# Patient Record
Sex: Male | Born: 1960 | Race: White | Hispanic: No | Marital: Single | Smoking: Current every day smoker
Health system: Southern US, Community
[De-identification: ages and names within clinical notes are randomized; demographics above are authoritative.]

---

## 2006-07-24 ENCOUNTER — Emergency Department (HOSPITAL_COMMUNITY): Admission: EM | Admit: 2006-07-24 | Discharge: 2006-07-24 | Payer: Self-pay | Admitting: Emergency Medicine

## 2006-07-26 ENCOUNTER — Emergency Department (HOSPITAL_COMMUNITY): Admission: EM | Admit: 2006-07-26 | Discharge: 2006-07-26 | Payer: Self-pay | Admitting: Emergency Medicine

## 2007-12-01 IMAGING — CR DG FOOT COMPLETE 3+V*L*
3 series · 3 of 3 positions shown · non-contrast
Comparison: none

CLINICAL DATA: Pain and swelling

Left foot three-view:
No previous for comparison. Normal alignment and mineralization. Negative for
fracture or other acute bone injury. No significant degenerative change. No
radiodense foreign body.

[view not recorded (1 of 3)]
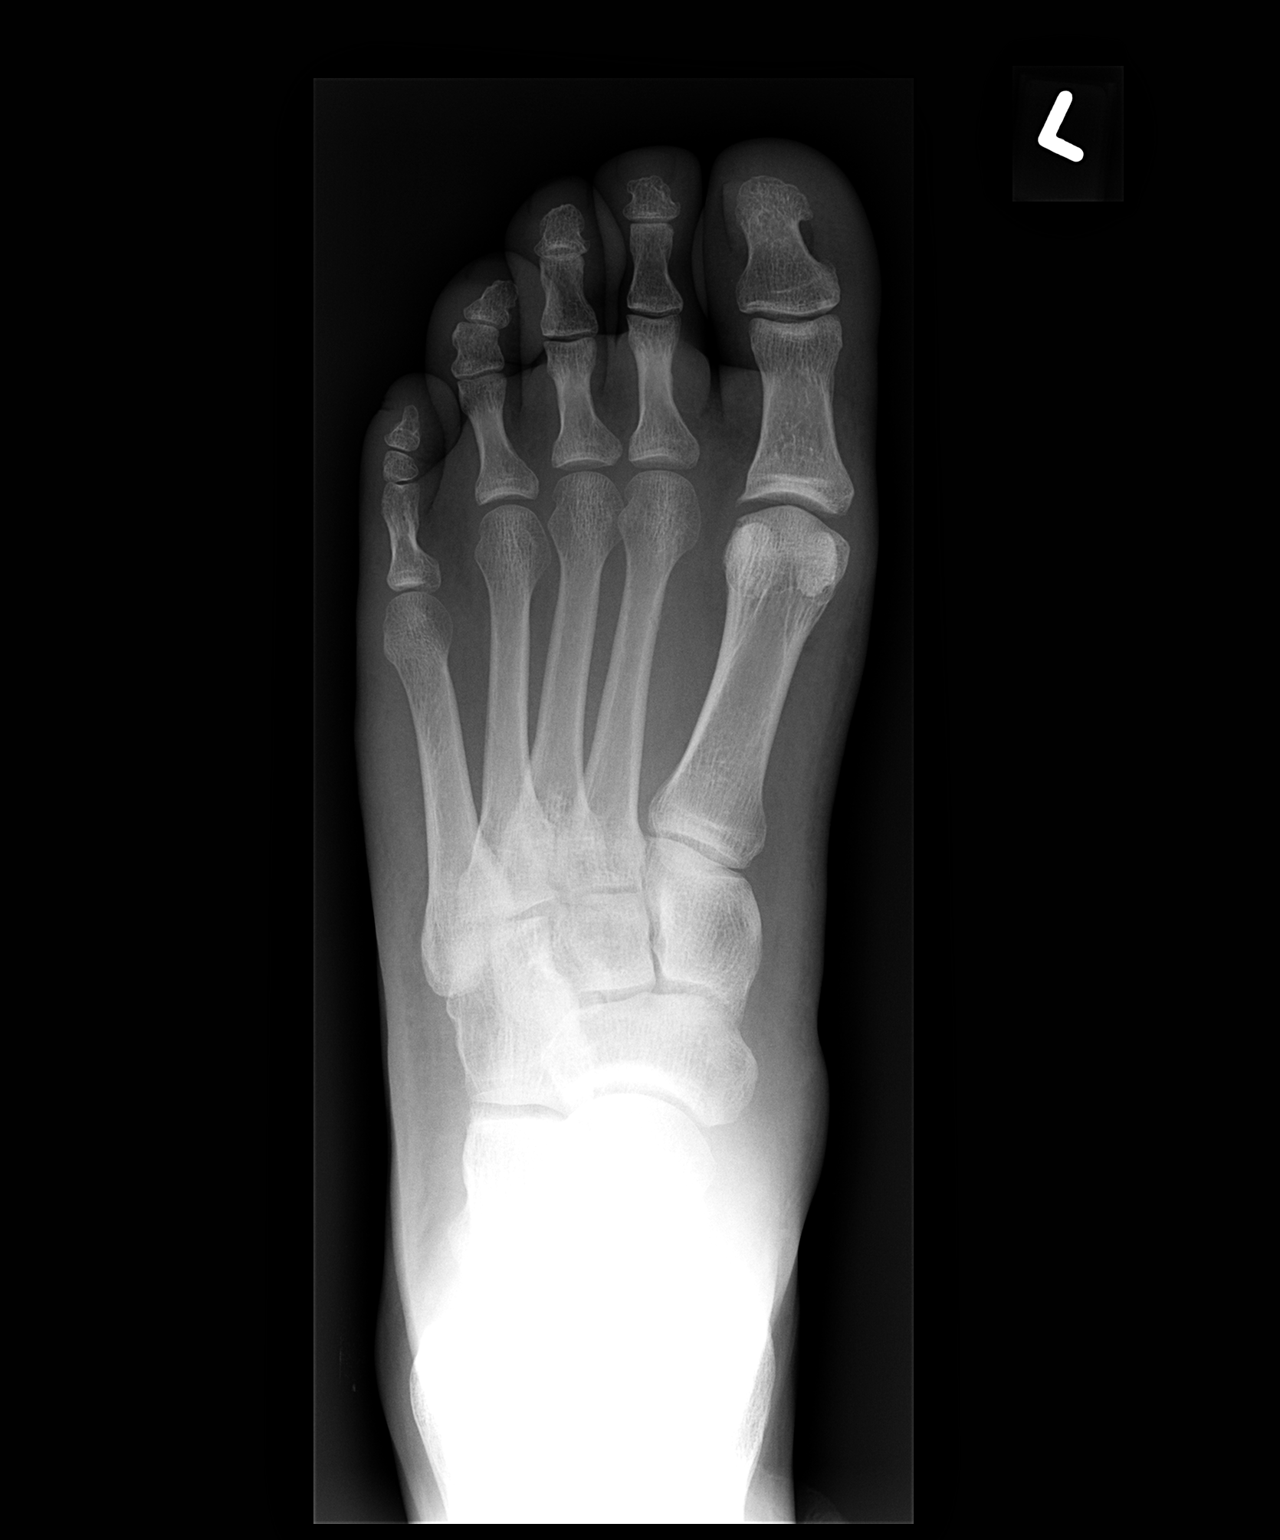

[view not recorded (2 of 3)]
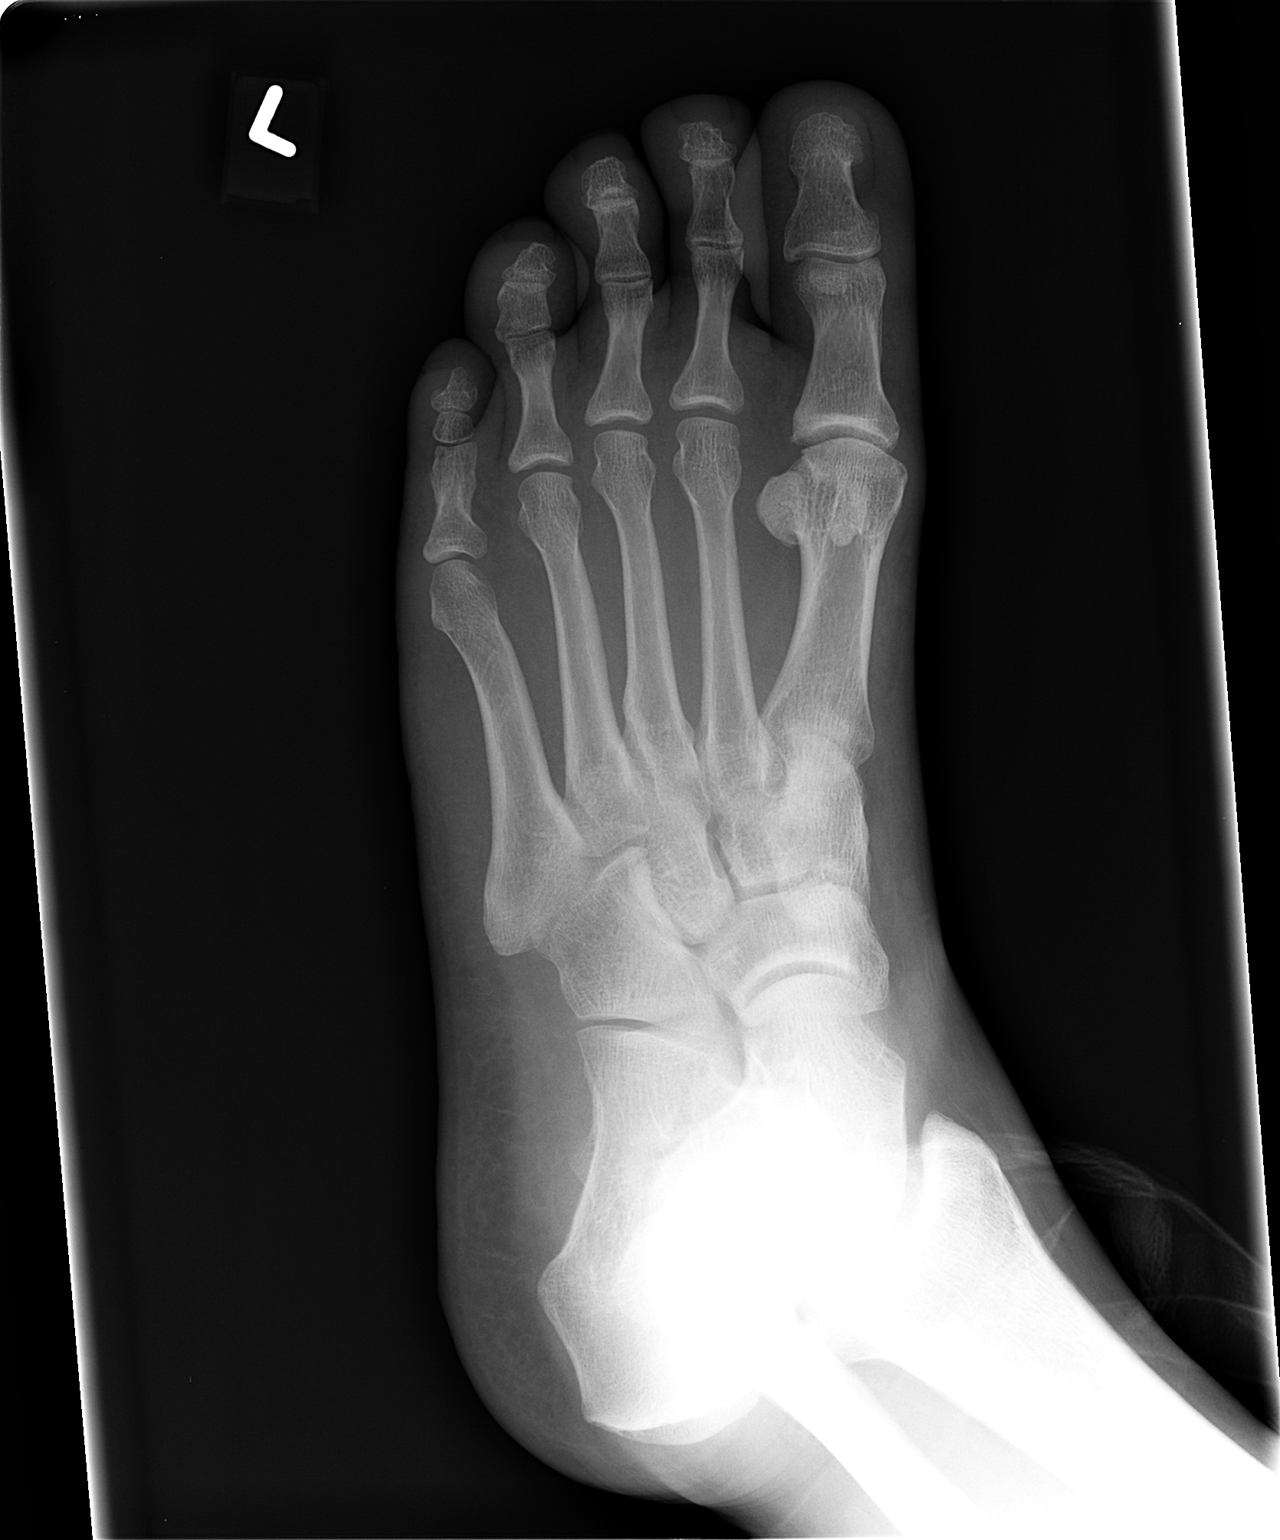

[view not recorded (3 of 3)]
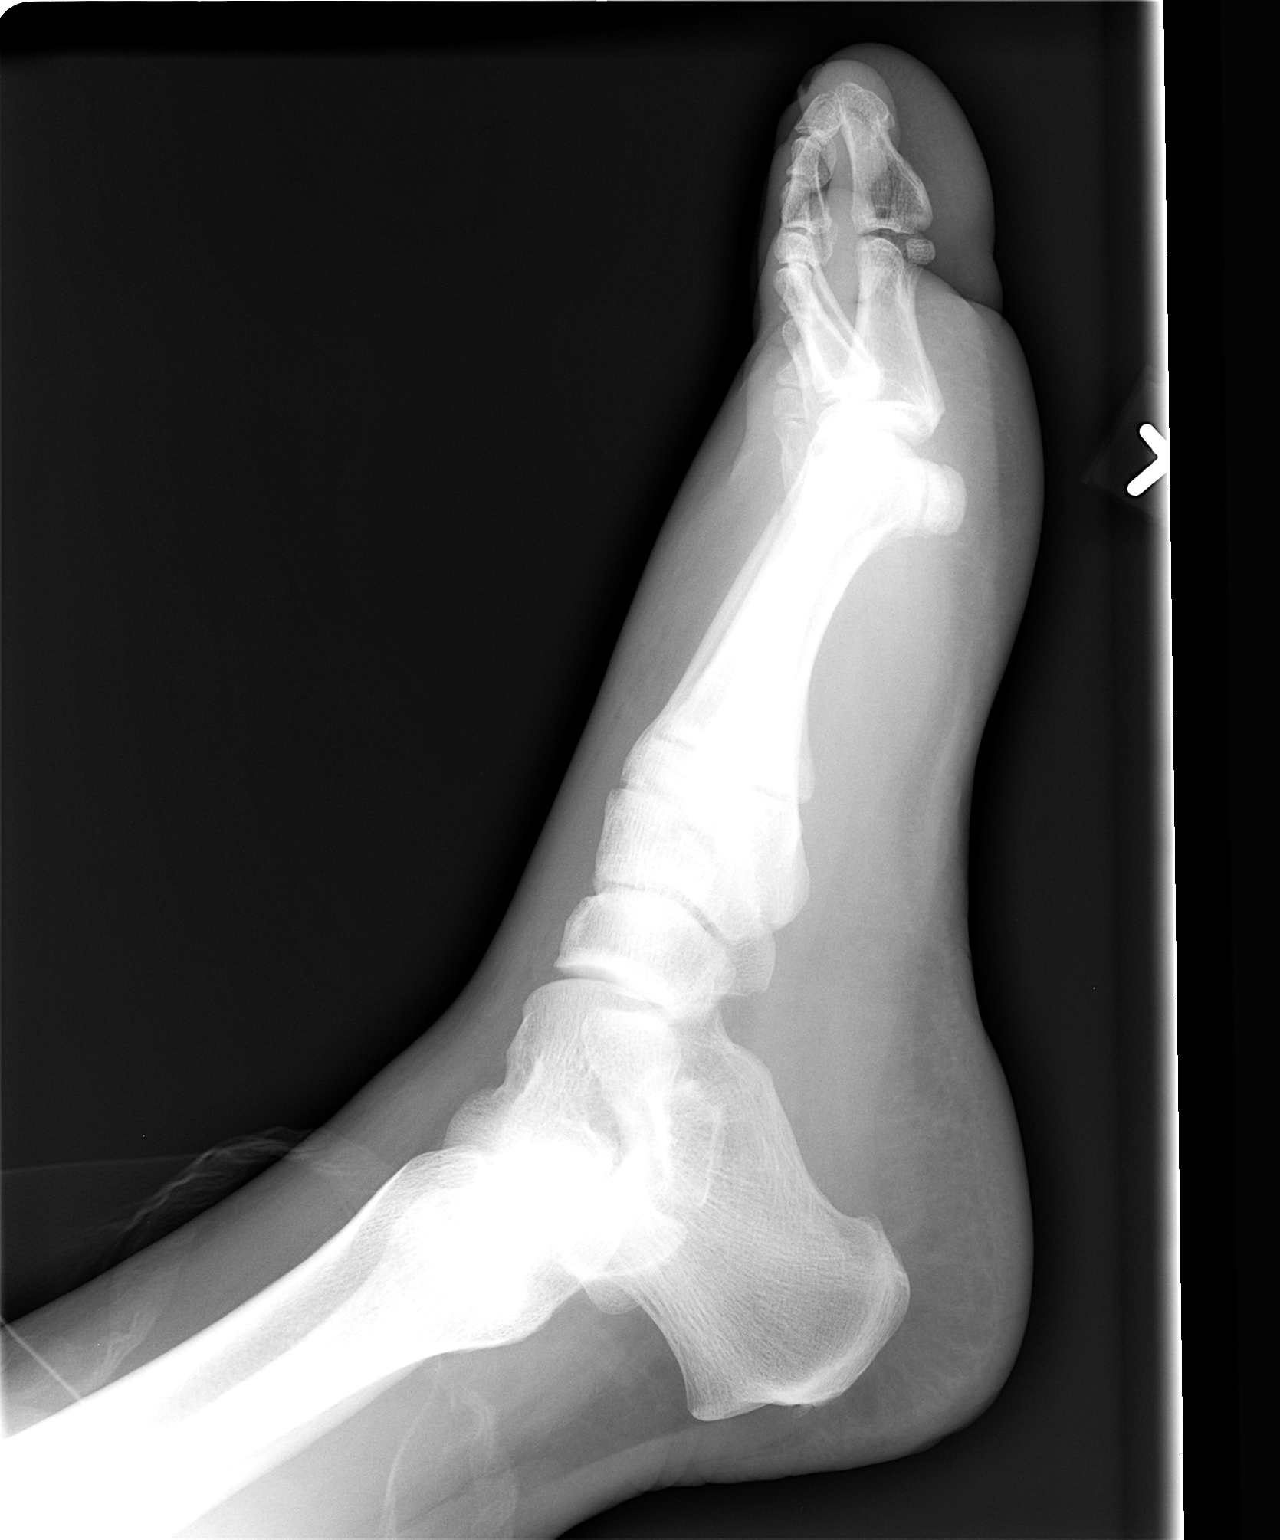

[3 of 3 positions shown; findings below may reference images not displayed]

IMPRESSION: 1. Negative

## 2015-04-21 ENCOUNTER — Ambulatory Visit (INDEPENDENT_AMBULATORY_CARE_PROVIDER_SITE_OTHER): Payer: 59 | Admitting: Pediatrics

## 2015-04-21 ENCOUNTER — Encounter: Payer: Self-pay | Admitting: Pediatrics

## 2015-04-21 VITALS — BP 142/94 | HR 85 | Temp 97.0°F | Ht 71.0 in | Wt 213.4 lb

## 2015-04-21 DIAGNOSIS — M7750 Other enthesopathy of unspecified foot: Secondary | ICD-10-CM

## 2015-04-21 DIAGNOSIS — M75102 Unspecified rotator cuff tear or rupture of left shoulder, not specified as traumatic: Secondary | ICD-10-CM | POA: Diagnosis not present

## 2015-04-21 DIAGNOSIS — M779 Enthesopathy, unspecified: Secondary | ICD-10-CM

## 2015-04-21 DIAGNOSIS — IMO0001 Reserved for inherently not codable concepts without codable children: Secondary | ICD-10-CM

## 2015-04-21 DIAGNOSIS — R030 Elevated blood-pressure reading, without diagnosis of hypertension: Secondary | ICD-10-CM

## 2015-04-21 MED ORDER — IBUPROFEN 800 MG PO TABS: 800.0000 mg | ORAL_TABLET | Freq: Three times a day (TID) | ORAL | Status: AC | PRN

## 2015-04-21 NOTE — Patient Instructions (Addendum)
Tylenol 1000mg  in the morning, 1000mg  at night. Do not take more than this.  Ibuprofen 800mg  every 8 hours. Do not take with goody powder or BC powder as they are similar medicines and can increase your risk of GI bleeding.  Use ice on shoulder after work  Range of motion exercises for your shoulder throughout the day

## 2015-04-21 NOTE — Progress Notes (Signed)
Subjective:    Patient ID: Andre Morse, male    DOB: [birth year], 54 y.o.   MRN: [id number]  CC: shoulder/foot pain  HPI: Andre Morse is a 54 y.o. male presenting on 04/21/2015 for Shoulder Pain and Foot Pain   Had what sounds like a partial tear to a rotator cuff muscle on L side a couple of months ago.  Trying to put off surgery for several months because he needs to continue working. He has been seen by orthopedics several times. Ortho gave a couple of Rx for narcotics then said he should follow up with a pain clinic if he is going to continue to wait for surgery. 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 He says he "gets by" with using his L arm as needed.  He also has a large bone spur on R heel that ortho said they could fix at same as shoulder surgery. Does not have any records from ortho with him today. He was taking oxycodone from his Ortho for pain at first. Now has not been taking anything, occasional ibuprofen. Gets a physical through work every year, has blood work drawn there. Says usually his BP has been fine.  Generally fairly healthy. Has a 53yo, 21yo, 15yo sons at home   ROS: All systems negative other than what is in the HPI  Past Medical History Patient Active Problem List   Diagnosis Date Noted  . Rotator cuff tear 04/22/2015  . Bone spur of foot 04/22/2015   Social History   Social History  . Marital Status: Single    Spouse Name: N/A  . Number of Children: N/A  . Years of Education: N/A   Occupational History  . Not on file.   Social History Main Topics  . Smoking status: Current Every Day Smoker -- 1.00 packs/day    Types: Cigarettes  . Smokeless tobacco: Not on file  . Alcohol Use: No  . Drug Use: No  . Sexual Activity: Not on file   Other Topics Concern  . Not on file   Social History Narrative  . No narrative on file   Family History  Problem Relation Age of Onset  . Heart disease Father        Current Outpatient Prescriptions  Medication Sig Dispense Refill  . ibuprofen (ADVIL,MOTRIN) 800 MG tablet Take 1 tablet (800 mg total) by mouth every 8 (eight) hours as needed. 30 tablet 0   No current facility-administered medications for this visit.       Objective:    BP 142/94 mmHg  Pulse 85  Temp(Src) 97 F (36.1 C) (Oral)  Ht 5\' 11"  (1.803 m)  Wt 213 lb 6.4 oz (96.798 kg)  BMI 29.78 kg/m2  Wt Readings from Last 3 Encounters:  04/21/15 213 lb 6.4 oz (96.798 kg)    Gen: NAD, alert, cooperative with exam, NCAT EYES: EOMI, no scleral injection or icterus ENT:  TMs pearly gray b/l, OP without erythema LYMPH: no cervical LAD CV: NRRR, normal S1/S2, no murmur, distal pulses 2+ b/l Resp: CTABL, no wheezes, normal WOB Abd: +BS, soft, NTND. no guarding or organomegaly Ext: No edema, warm Neuro: Alert and oriented, sensation intact throughout.  MSK: no atrophy of UE. Normal shoulders/arms to inspection. No pain with active internal/external rotation of shoulder with elbows down at sides b/l. Pain with L sided active shoulder abduction over 80 degrees. Less pain with passive ROM of L shoulder over  90 degrees. Normal ROM R shoulder. Several cm nodule at attachment of R achilles tendon. No breakdown of skin, minimal erythema.     Assessment & Plan:   Andre Morse was seen today for shoulder pain and foot pain as a new patient. I do not have his prior records or imaging available. He is not sure how long he wants to wait prior to shoulder surgery. Discussed safe doses of ibuprofen, pt has not tried NSAIDs for pain regularly. Will refer to pain clinic per pt request as he does not think NSAIDs will work. Also discussed gentle pendulum swing ROM exercises to prevent frozen shoulder. Pt to come back for repeat BP check with nurse in 2 weeks.  Diagnoses and all orders for this visit:  Rotator cuff tear, left -     Ambulatory referral to Pain Clinic -     ibuprofen (ADVIL,MOTRIN) 800 MG  tablet; Take 1 tablet (800 mg total) by mouth every 8 (eight) hours as needed.  Bone spur of foot  Elevated blood pressure   Follow up plan: Return in about 2 weeks (around 05/05/2015) for BP recheck with nurse.  Rex Kras, MD [place] [facility] 04/21/2015, 9:21 AM

## 2015-04-22 DIAGNOSIS — M7750 Other enthesopathy of unspecified foot: Secondary | ICD-10-CM | POA: Insufficient documentation

## 2015-04-22 DIAGNOSIS — M75100 Unspecified rotator cuff tear or rupture of unspecified shoulder, not specified as traumatic: Secondary | ICD-10-CM | POA: Insufficient documentation

## 2017-09-12 ENCOUNTER — Telehealth (HOSPITAL_COMMUNITY): Payer: Self-pay | Admitting: Psychology

## 2017-09-15 ENCOUNTER — Telehealth (HOSPITAL_COMMUNITY): Payer: Self-pay | Admitting: Psychology

## 2017-09-16 ENCOUNTER — Telehealth (HOSPITAL_COMMUNITY): Payer: Self-pay | Admitting: Psychology

## 2022-11-11 ENCOUNTER — Ambulatory Visit: Payer: Self-pay | Admitting: Family Medicine
